# Patient Record
Sex: Male | Born: 1995 | Race: White | Hispanic: No | Marital: Single | Smoking: Never smoker
Health system: Southern US, Community
[De-identification: ages and names within clinical notes are randomized; demographics above are authoritative.]

---
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
## 2014-01-27 ENCOUNTER — Ambulatory Visit: Payer: Self-pay | Admitting: Family Medicine

## 2015-01-21 IMAGING — CR DG THORACIC SPINE 2-3V
1 series · 3 of 3 positions shown · non-contrast
Comparison: None.

CLINICAL DATA: Back pain

EXAM:
THORACIC SPINE - 2 VIEW

[Series 1: ap · 0.17mm/px · 3 of 3 slices shown]
[im 1/3]
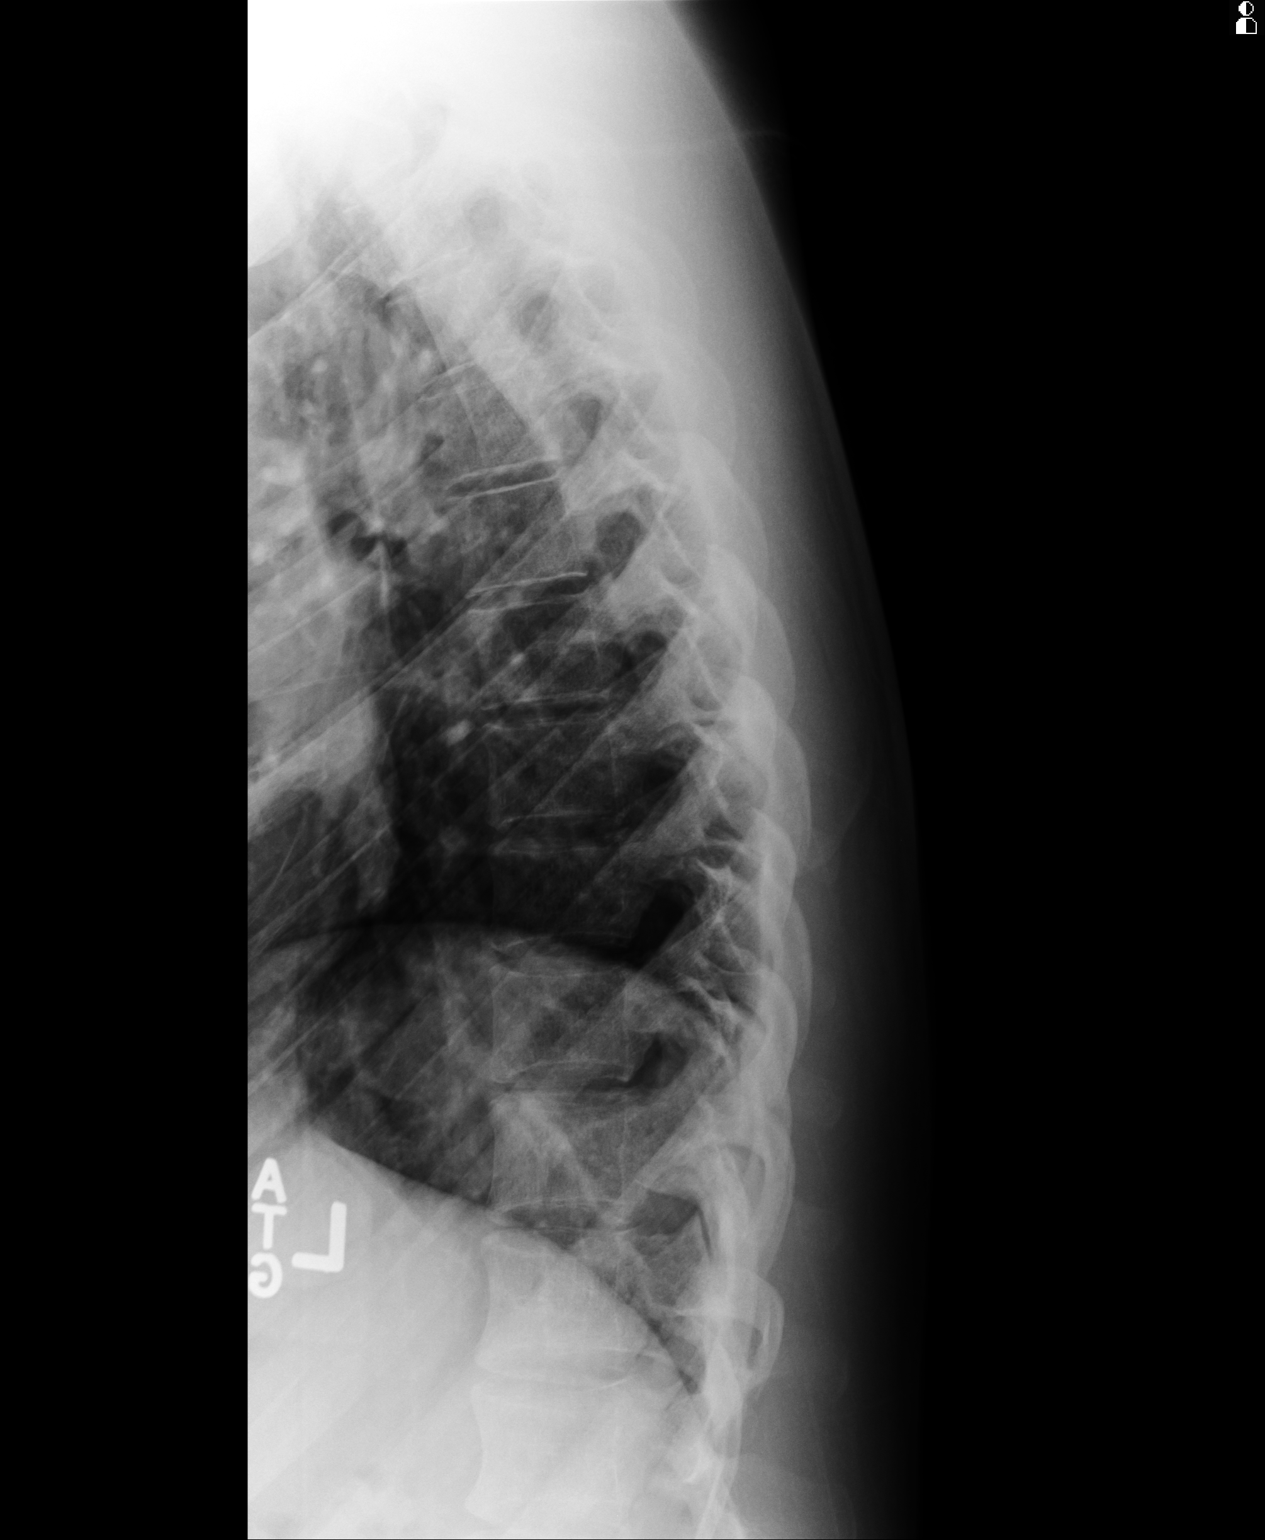
[im 2/3]
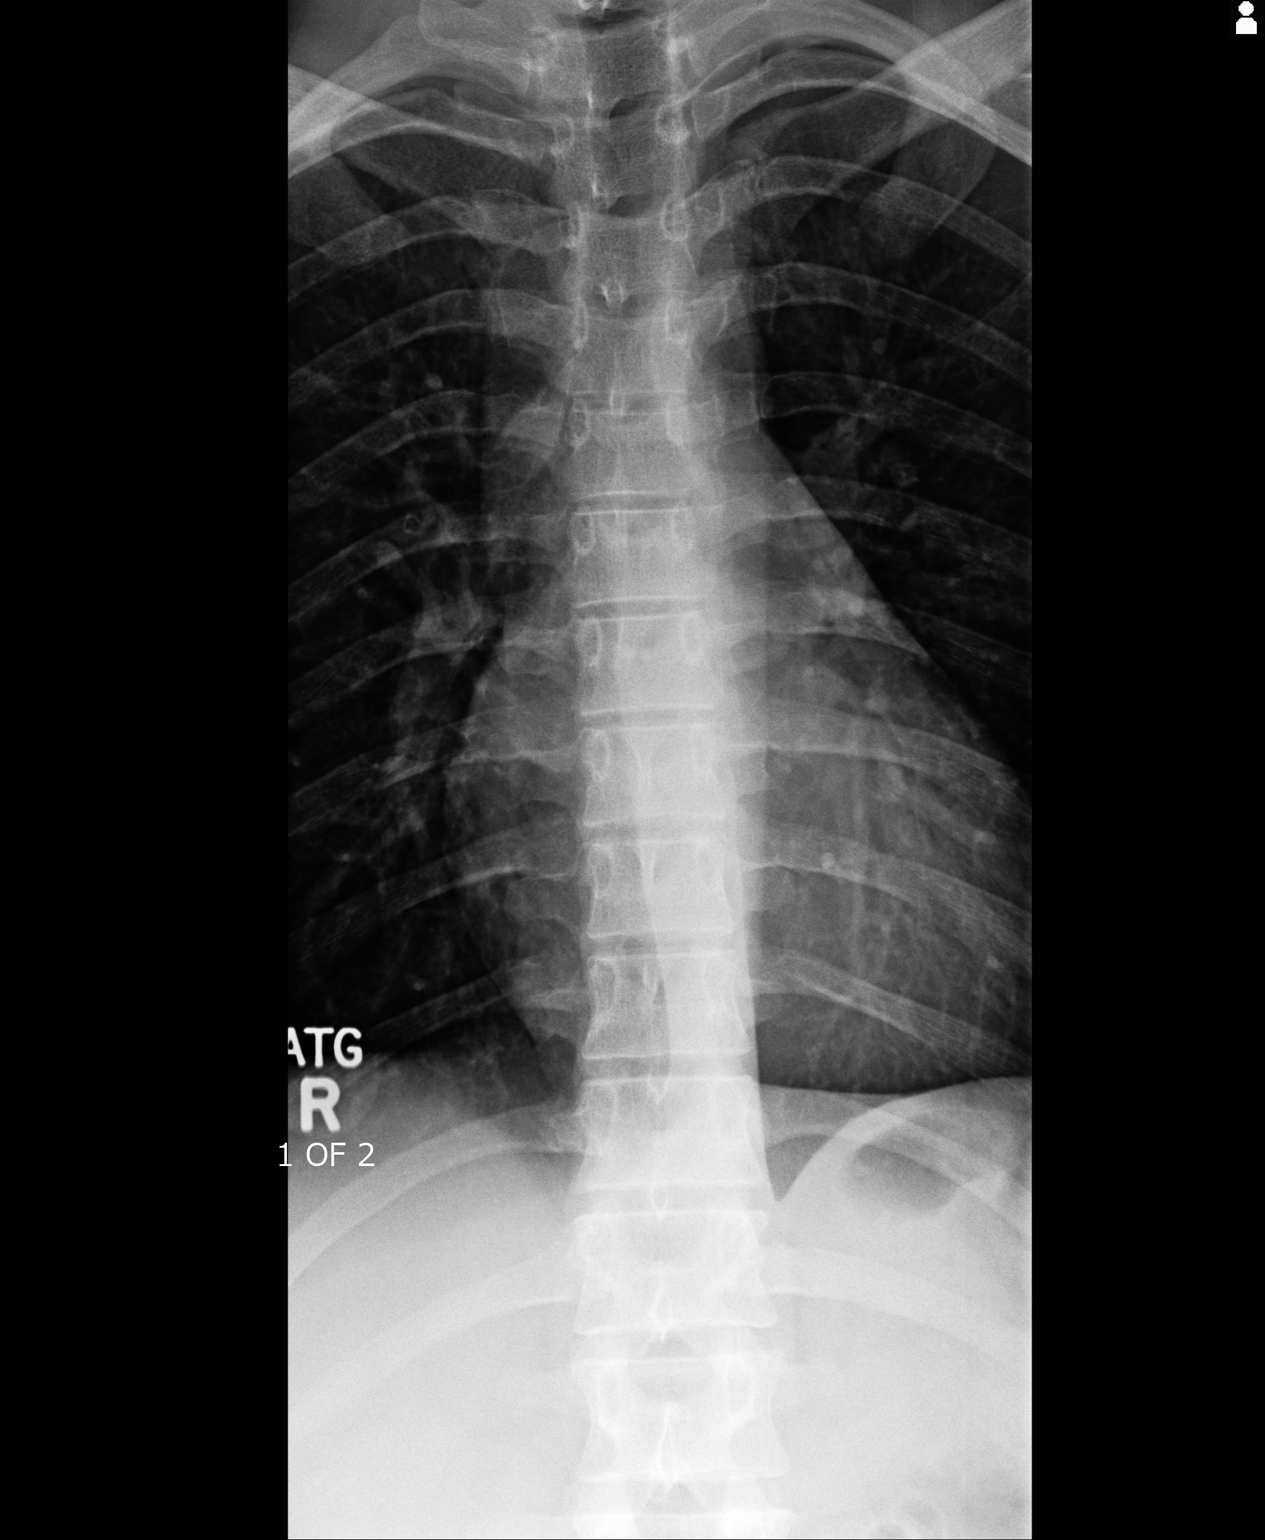
[im 3/3]
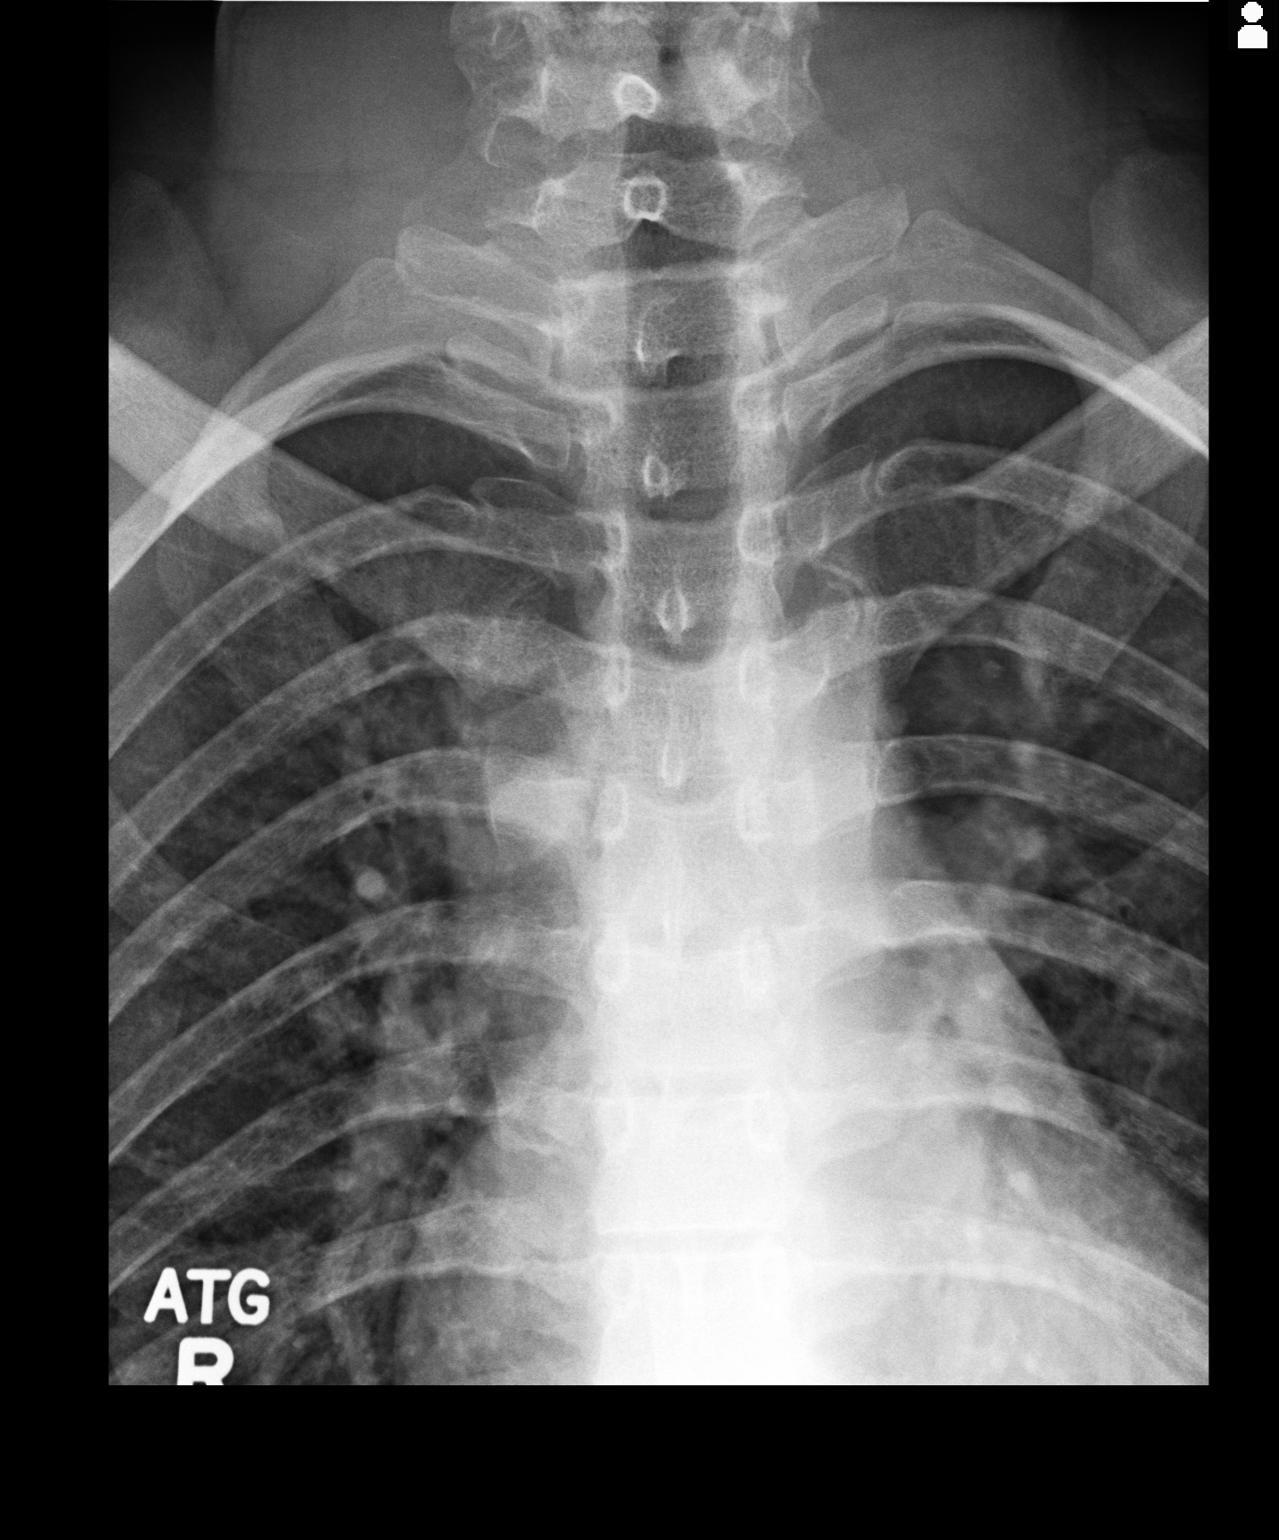

[3 of 3 positions shown; findings below may reference images not displayed]

FINDINGS: There is no evidence of thoracic spine fracture. Alignment is
normal. No other significant bone abnormalities are identified.
IMPRESSION: Negative.

## 2015-01-21 IMAGING — CR DG SHOULDER 3+V*R*
1 series · 3 of 3 positions shown · non-contrast
Comparison: None.

CLINICAL DATA: Right shoulder pain

EXAM:
DG SHOULDER 3+ VIEWS RIGHT

[Series 1: grashey · 0.17mm/px · 3 of 3 slices shown]
[im 1/3]
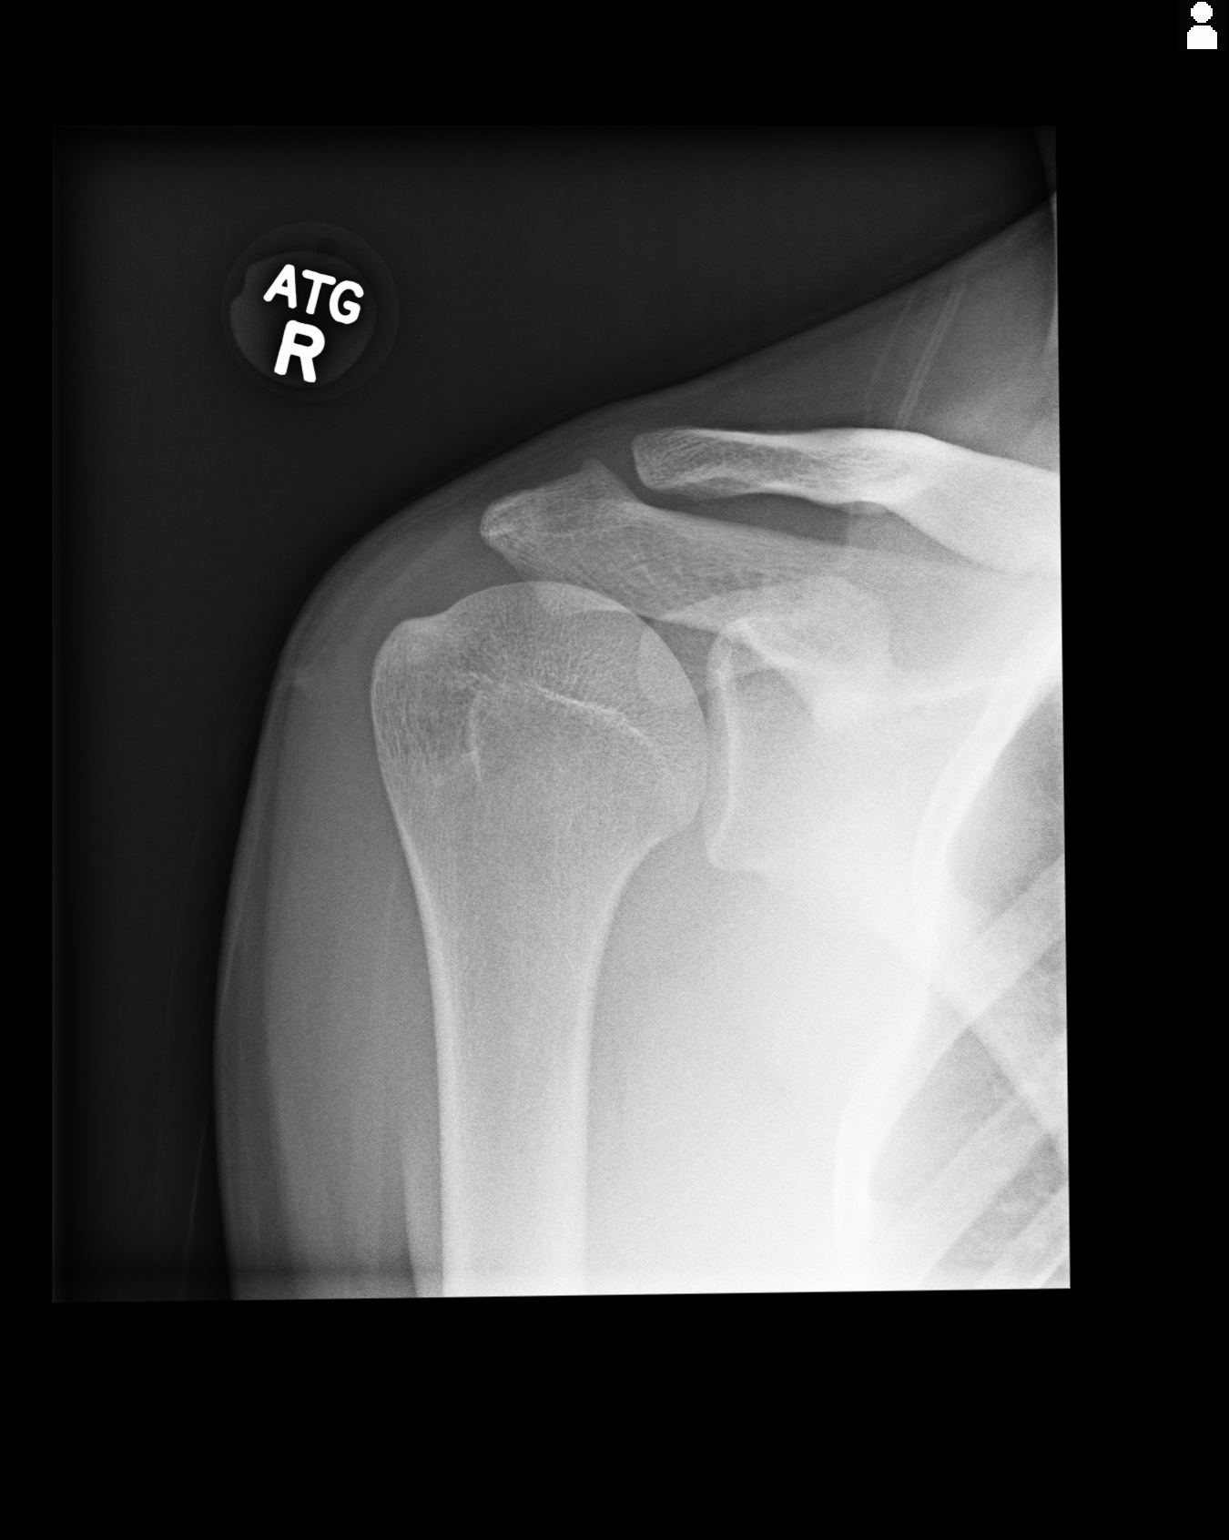
[im 2/3]
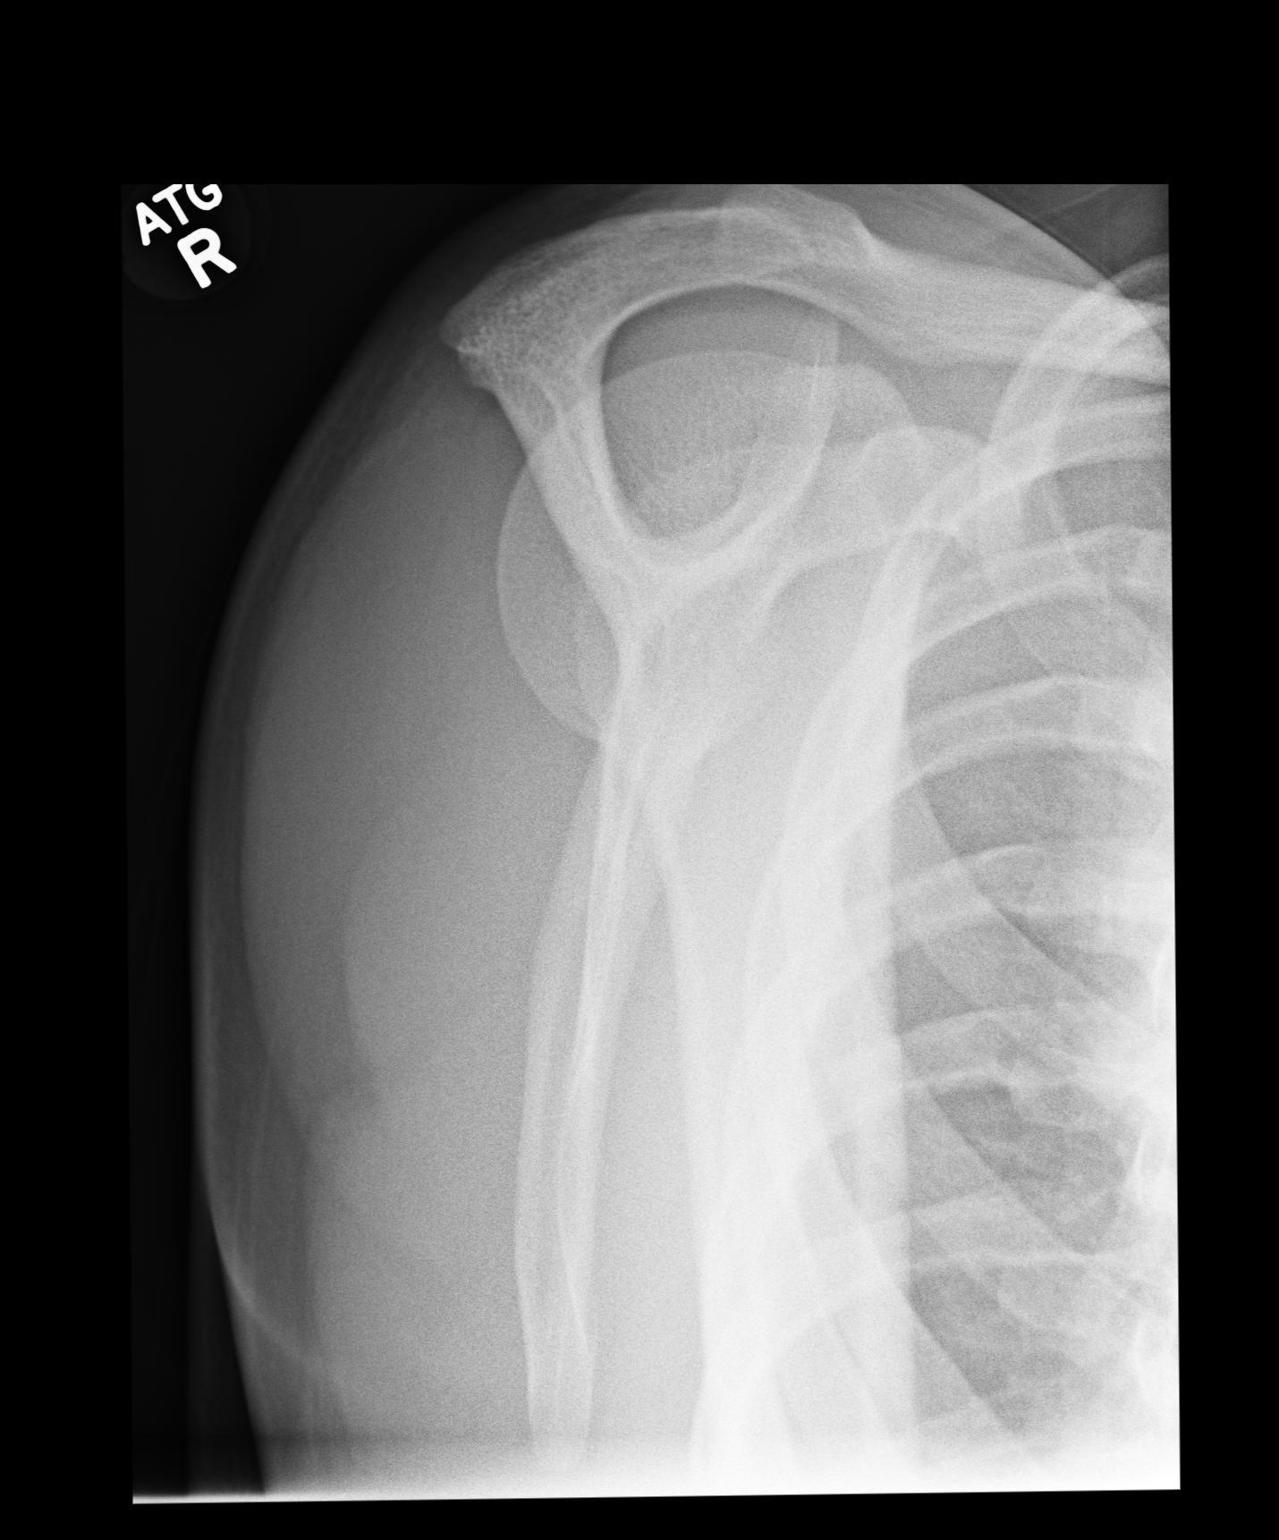
[im 3/3]
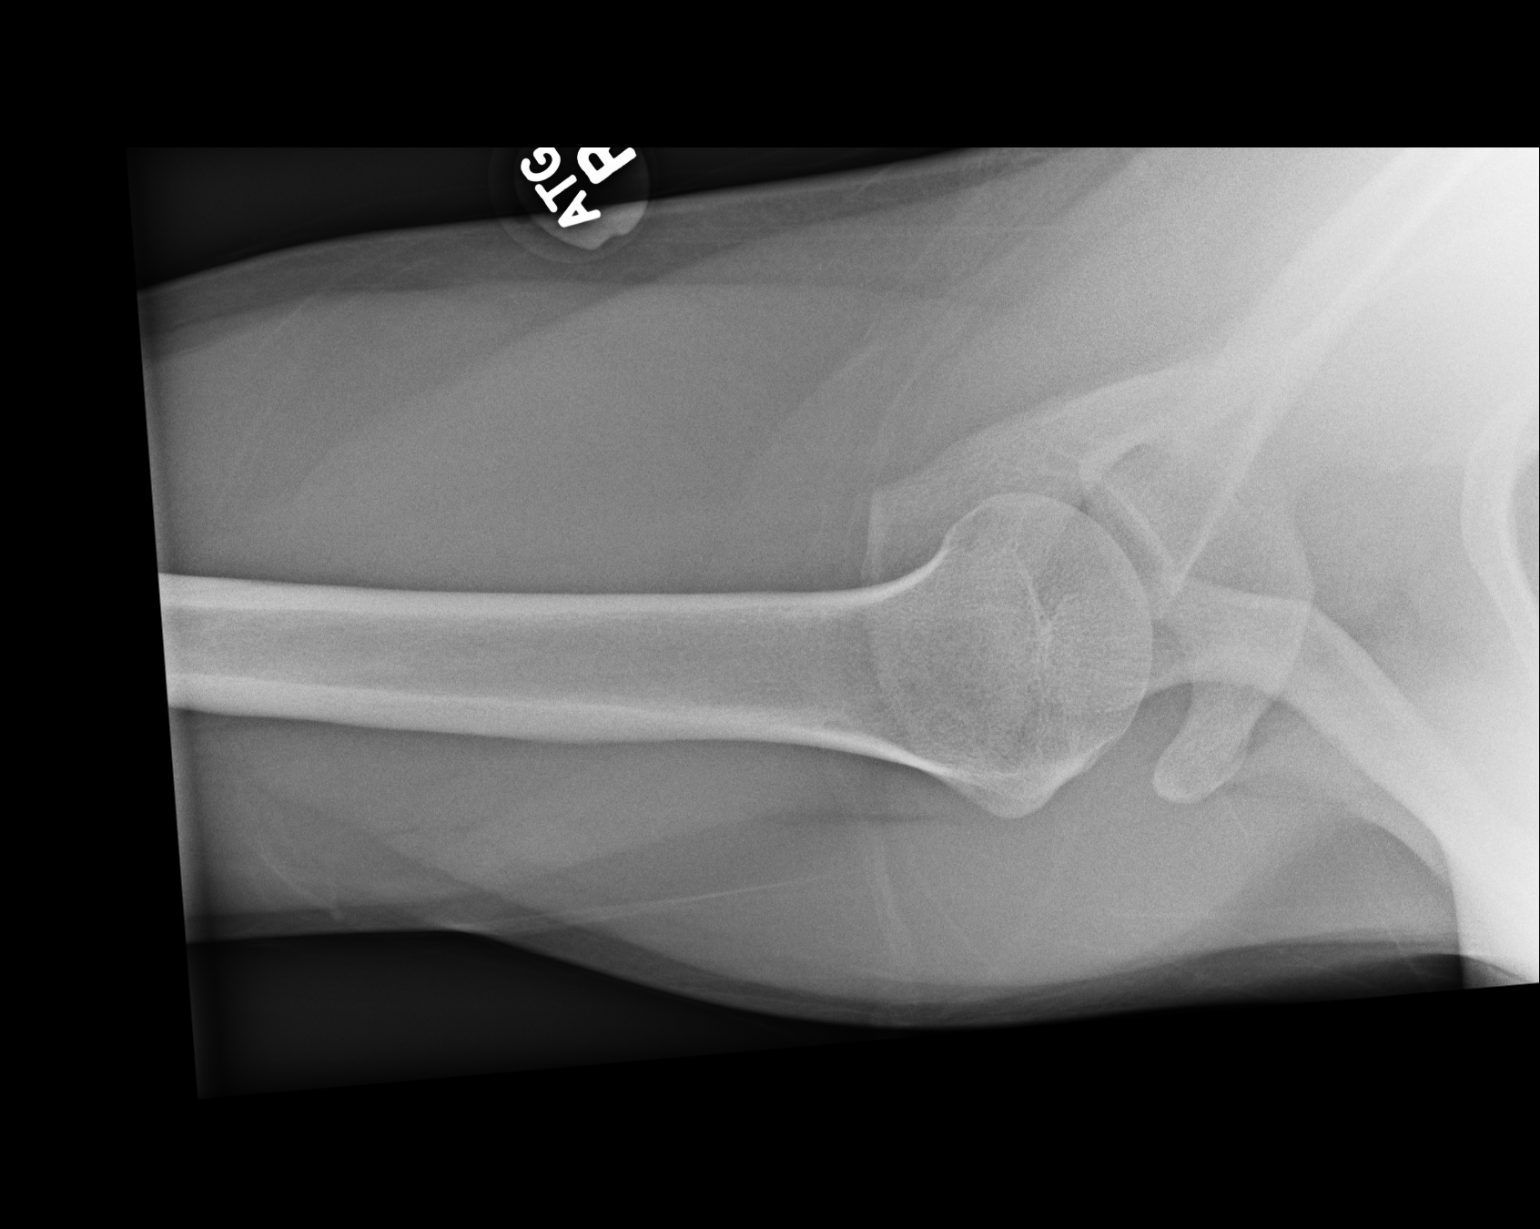

[3 of 3 positions shown; findings below may reference images not displayed]

FINDINGS: There is no evidence of fracture or dislocation. There is no
evidence of arthropathy or other focal bone abnormality. Soft
tissues are unremarkable.
IMPRESSION: Negative.

## 2020-03-17 ENCOUNTER — Ambulatory Visit: Payer: Self-pay | Admitting: Physician Assistant

## 2020-03-17 ENCOUNTER — Other Ambulatory Visit: Payer: Self-pay

## 2020-03-17 DIAGNOSIS — Z202 Contact with and (suspected) exposure to infections with a predominantly sexual mode of transmission: Secondary | ICD-10-CM

## 2020-03-17 DIAGNOSIS — N341 Nonspecific urethritis: Secondary | ICD-10-CM

## 2020-03-17 DIAGNOSIS — Z113 Encounter for screening for infections with a predominantly sexual mode of transmission: Secondary | ICD-10-CM

## 2020-03-17 LAB — GRAM STAIN

## 2020-03-17 MED ORDER — AZITHROMYCIN 500 MG PO TABS
1000.0000 mg | ORAL_TABLET | Freq: Once | ORAL | Status: AC
  Administered 2020-03-17: 1000 mg via ORAL

## 2020-03-18 ENCOUNTER — Encounter: Payer: Self-pay | Admitting: Physician Assistant

## 2020-03-18 NOTE — Progress Notes (Signed)
   [facility] Department STI clinic/screening visit  Subjective:  Scott Edwards is a 24 y.o. male being seen today for an STI screening visit. The patient reports they do not have symptoms.    Patient has the following medical conditions:  There are no problems to display for this patient.    Chief Complaint  Patient presents with  . SEXUALLY TRANSMITTED DISEASE    HPI  Patient reports that he is not having any symptoms but has a partner that tested positive for Chlamydia.  Denies chronic conditions and takes MVI daily.   See flowsheet for further details and programmatic requirements.    The following portions of the patient's history were reviewed and updated as appropriate: allergies, current medications, past medical history, past social history, past surgical history and problem list.  Objective:  There were no vitals filed for this visit.  Physical Exam Constitutional:      General: He is not in acute distress.    Appearance: Normal appearance. He is normal weight.  HENT:     Head: Normocephalic and atraumatic.     Comments: No nits, lice or hair loss. No cervical, supraclavicular or axillary adenopathy.    Mouth/Throat:     Mouth: Mucous membranes are moist.     Pharynx: Oropharynx is clear. No oropharyngeal exudate or posterior oropharyngeal erythema.  Eyes:     Conjunctiva/sclera: Conjunctivae normal.  Pulmonary:     Effort: Pulmonary effort is normal.  Abdominal:     Palpations: Abdomen is soft. There is no mass.     Tenderness: There is no abdominal tenderness. There is no guarding or rebound.  Genitourinary:    Penis: Normal.      Testes: Normal.     Comments: Pubic area without nits, lice, edema, erythema, lesions and inguinal adenopathy. Penis circumcised and without rash, lesion and discharge at meatus. Musculoskeletal:     Cervical back: Neck supple. No tenderness.  Skin:    General: Skin is warm and dry.     Findings: No  bruising, erythema, lesion or rash.  Neurological:     Mental Status: He is alert and oriented to person, place, and time.  Psychiatric:        Mood and Affect: Mood normal.        Behavior: Behavior normal.        Thought Content: Thought content normal.        Judgment: Judgment normal.       Assessment and Plan:  Scott Edwards is a 24 y.o. male presenting to the [facility] Department for STI screening  1. Screening for STD (sexually transmitted disease) Patient into clinic without symptoms. Rec condoms with all sex. Await test results.  Counseled that RN will call if needs to RTC for further treatment once results are back.  - Gram stain - Gonococcus culture - HIV [facility] - Syphilis Serology, [facility]  2. Chlamydia contact Will treat as a contact to Chlamydia with Azithromycin 1g po DOT today. No sex for 7 days and until after partner completes treatment. RTC for re-treatment if vomits <2 hr after taking medicine. - azithromycin (ZITHROMAX) tablet 1,000 mg  3. NGU (nongonococcal urethritis) See above.     No follow-ups on file.  No future appointments.  Matt Holmes, PA

## 2020-03-21 LAB — GONOCOCCUS CULTURE
